# Patient Record
Sex: Female | Born: 1995 | Race: Black or African American | Hispanic: No | Marital: Single | Smoking: Never smoker
Health system: Southern US, Community
[De-identification: ages and names within clinical notes are randomized; demographics above are authoritative.]

## PROBLEM LIST (undated history)

## (undated) DIAGNOSIS — T7840XA Allergy, unspecified, initial encounter: Secondary | ICD-10-CM

## (undated) HISTORY — DX: Allergy, unspecified, initial encounter: T78.40XA

---
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
## 2018-11-16 ENCOUNTER — Telehealth: Payer: Self-pay

## 2018-11-16 ENCOUNTER — Ambulatory Visit: Payer: Self-pay | Admitting: *Deleted

## 2018-11-16 NOTE — Telephone Encounter (Signed)
We are unable to establish new patients at this time. We can do a video call to discuss symptoms or go to an urgent care clinic.  she can made an establish care appt once pandemic period is over.

## 2018-11-16 NOTE — Telephone Encounter (Signed)
Please advise New pt wanting to establish care. Pt went for a walk on Thursday 4/9  And after the walk pt said she took a shower and noticed that she couldn't smell her body wash. After showering pt made a brownie and noticed that she couldn't taste that. Pt wants to make an appointment today to talk about this issue.

## 2018-11-16 NOTE — Telephone Encounter (Signed)
Called Pt . Made appt for 11/17/2018 11:30, Pt will call or message through MyChart  To notify if she needs to change time. DoxyMe will be sent in the morning , holding off until she completely confirms . 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

## 2018-11-16 NOTE — Telephone Encounter (Signed)
Pt called with having symptoms of loss of taste and smell. That started last Thursday. She works for the city of [employer], Corporate treasurer. They are practice social distancing and routine of checking for fevers and other symptoms.  She is denying fever, shortness of breath, body aches,runny nose, dry cough or chest pain.  She moved here at the end of February from [place].  Flow at [facility] notified regarding recommendation and appointment.  She would like also to establish care with a provider there. Advised of having a virtual visit. Pt voiced understanding. Call conference in with LB at [facility]. Advised to call back or call 911 for respiratory distress. Pt voiced understanding.    Reason for Disposition . 1] COVID-19 infection diagnosed or suspected AND [2] mild symptoms (fever, cough) AND [2] no trouble breathing or other complications  Answer Assessment - Initial Assessment Questions 1. COVID-19 DIAGNOSIS: "Who made your Coronavirus (COVID-19) diagnosis?" "Was it confirmed by a positive lab test?" If not diagnosed by a HCP, ask "Are there lots of cases (community spread) where you live?" (See public health department website, if unsure)   * MAJOR community spread: high number of cases; numbers of cases are increasing; many people hospitalized.   * MINOR community spread: low number of cases; not increasing; few or no people hospitalized     Exposed out in the community 2. ONSET: "When did the COVID-19 symptoms start?"      Thursday evening 3. WORST SYMPTOM: "What is your worst symptom?" (e.g., cough, fever, shortness of breath, muscle aches)     Loss of smell and taste 4. COUGH: "How bad is the cough?"       Productive cough a day before that and now is gone 5. FEVER: "Do you have a fever?" If so, ask: "What is your temperature, how was it measured, and when did it start?"     no 6. RESPIRATORY STATUS: "Describe your breathing?" (e.g., shortness of  breath, wheezing, unable to speak)      no 7. BETTER-SAME-WORSE: "Are you getting better, staying the same or getting worse compared to yesterday?"  If getting worse, ask, "In what way?"     The same 8. HIGH RISK DISEASE: "Do you have any chronic medical problems?" (e.g., asthma, heart or lung disease, weak immune system, etc.)     Taking solaris for psariss 9. PREGNANCY: "Is there any chance you are pregnant?" "When was your last menstrual period?"     Not pregnant, on birth control last period was a year ago 10. OTHER SYMPTOMS: "Do you have any other symptoms?"  (e.g., runny nose, headache, sore throat, loss of smell)       Loss of smeel  Protocols used: CORONAVIRUS (COVID-19) DIAGNOSED OR SUSPECTED-A-AH

## 2018-11-16 NOTE — Telephone Encounter (Signed)
Lets add her for tomorrow, 30 min new patient appt. Thanks!

## 2018-11-17 ENCOUNTER — Ambulatory Visit (INDEPENDENT_AMBULATORY_CARE_PROVIDER_SITE_OTHER): Payer: 59 | Admitting: Family Medicine

## 2018-11-17 ENCOUNTER — Encounter: Payer: Self-pay | Admitting: Family Medicine

## 2018-11-17 DIAGNOSIS — R430 Anosmia: Secondary | ICD-10-CM | POA: Diagnosis not present

## 2018-11-17 NOTE — Assessment & Plan Note (Signed)
-  Discussed with her possibility that this could be related to COVID-19.  She has not other respiratory symptoms at this time however given increasing prevalence and current mitigation recommendations I have advised her to self quarantine for a period of at least 7 days since symptoms onset and continue this until 72 hours after symptoms have resolved.   -She will let me know if she has any new or worsening symptoms develop.

## 2018-11-17 NOTE — Progress Notes (Signed)
Alice StallsKorina A Stell - 23 y.o. female MRN [id number]  Date of birth: 02-Oct-1995   This visit type was conducted due to national recommendations for restrictions regarding the COVID-19 Pandemic (e.g. social distancing).  This format is felt to be most appropriate for this patient at this time.  All issues noted in this document were discussed and addressed.  No physical exam was performed (except for noted visual exam findings with Video Visits).  I discussed the limitations of evaluation and management by telemedicine and the availability of in person appointments. The patient expressed understanding and agreed to proceed.  I connected with@ on 11/17/18 at 11:30 AM EDT by a video enabled telemedicine application and verified that I am speaking with the correct person using two identifiers.   Patient Location: Home [address] [place] [id number]   Provider location:   [place]  Chief Complaint  Patient presents with  . Establish Care    NP, lost taste buds/ sense of smell , onset 6 days    HPI  Alice Kline is a 23 y.o. female who presents via audio/video conferencing for a telehealth visit today.  This is her initial visit and she has complaint of loss of taste and smell.  Reports that symptoms started about 5-6 days ago.  She noticed that she couldn't smell her body wash in the shower and has not been able to taste foods very well.  She did have a couple of days of nasal congestion prior to this.  She denies fever, chills, cough, shortness of breath, body aches, sinus pain or sore throat.  She is relatively healthy otherwise but does take stelara for psoriasis.    ROS:  A comprehensive ROS was completed and negative except as noted per HPI  Past Medical History:  Diagnosis Date  . Allergy     No past surgical history on file.  Family History  Problem Relation Age of Onset  . Cancer Maternal Grandfather   . Diabetes Maternal Grandfather   . Diabetes Paternal  Grandmother   . Hypertension Paternal Grandmother     Social History   Socioeconomic History  . Marital status: Single    Spouse name: Not on file  . Number of children: Not on file  . Years of education: Not on file  . Highest education level: Not on file  Occupational History  . Not on file  Social Needs  . Financial resource strain: Not on file  . Food insecurity:    Worry: Not on file    Inability: Not on file  . Transportation needs:    Medical: Not on file    Non-medical: Not on file  Tobacco Use  . Smoking status: Never Smoker  Substance and Sexual Activity  . Alcohol use: No  . Drug use: Never  . Sexual activity: Never    Birth control/protection: None  Lifestyle  . Physical activity:    Days per week: Not on file    Minutes per session: Not on file  . Stress: Not on file  Relationships  . Social connections:    Talks on phone: Not on file    Gets together: Not on file    Attends religious service: Not on file    Active member of club or organization: Not on file    Attends meetings of clubs or organizations: Not on file    Relationship status: Not on file  . Intimate partner violence:    Fear of current  or ex partner: Not on file    Emotionally abused: Not on file    Physically abused: Not on file    Forced sexual activity: Not on file  Other Topics Concern  . Not on file  Social History Narrative   ** Merged History Encounter **       [employer], 11 th grade     Current Outpatient Medications:  Marland Kitchen  Ustekinumab (STELARA) 45 MG/0.5ML SOLN, Inject into the skin., Disp: , Rfl:   EXAM:  VITALS per patient if applicable: BP 121/80 Comment: taken 3 wks ago.  Pulse 100 Comment: smartwatch pt reorted  Ht 5\' 6"  (1.676 m)   Wt 240 lb (108.9 kg) Comment: pt reported  BMI 38.74 kg/m   GENERAL: alert, oriented, appears well and in no acute distress  HEENT: atraumatic, conjunttiva clear, no obvious abnormalities on inspection of external nose and ears   NECK: normal movements of the head and neck  LUNGS: on inspection no signs of respiratory distress, breathing rate appears normal, no obvious gross SOB, gasping or wheezing  CV: no obvious cyanosis  MS: moves all visible extremities without noticeable abnormality  PSYCH/NEURO: pleasant and cooperative, no obvious depression or anxiety, speech and thought processing grossly intact  ASSESSMENT AND PLAN:  Discussed the following assessment and plan:  Anosmia -Discussed with her possibility that this could be related to COVID-19.  She has not other respiratory symptoms at this time however given increasing prevalence and current mitigation recommendations I have advised her to self quarantine for a period of at least 7 days since symptoms onset and continue this until 72 hours after symptoms have resolved.   -She will let me know if she has any new or worsening symptoms develop.        I discussed the assessment and treatment plan with the patient. The patient was provided an opportunity to ask questions and all were answered. The patient agreed with the plan and demonstrated an understanding of the instructions.   The patient was advised to call back or seek an in-person evaluation if the symptoms worsen or if the condition fails to improve as anticipated.     Everrett Coombe, DO

## 2019-10-28 ENCOUNTER — Ambulatory Visit: Payer: 59 | Attending: Internal Medicine

## 2019-10-28 DIAGNOSIS — Z23 Encounter for immunization: Secondary | ICD-10-CM

## 2019-10-28 NOTE — Progress Notes (Signed)
   Covid-19 Vaccination Clinic  Name:  Alice Kline    MRN: [id number] DOB: [birth year]  10/28/2019  [name] was observed post Covid-19 immunization for 15 minutes without incident. She was provided with Vaccine Information Sheet and instruction to access the V-Safe system.   [name] was instructed to call 911 with any severe reactions post vaccine: Marland Kitchen Difficulty breathing  . Swelling of face and throat  . A fast heartbeat  . A bad rash all over body  . Dizziness and weakness   Immunizations Administered    Name Date Dose VIS Date Route   Pfizer COVID-19 Vaccine 10/28/2019  2:17 PM 0.3 mL 07/15/2019 Intramuscular   Manufacturer: [employer], [employer]   Lot: [id number]   NDC: [address]-4

## 2019-11-23 ENCOUNTER — Ambulatory Visit: Payer: 59 | Attending: Internal Medicine

## 2019-11-23 DIAGNOSIS — Z23 Encounter for immunization: Secondary | ICD-10-CM

## 2019-11-23 NOTE — Progress Notes (Signed)
   Covid-19 Vaccination Clinic  Name:  Alice Kline    MRN: [id number] DOB: 1996-04-14  11/23/2019  [name] was observed post Covid-19 immunization for 15 minutes without incident. She was provided with Vaccine Information Sheet and instruction to access the V-Safe system.   [name] was instructed to call 911 with any severe reactions post vaccine: Marland Kitchen Difficulty breathing  . Swelling of face and throat  . A fast heartbeat  . A bad rash all over body  . Dizziness and weakness   Immunizations Administered    Name Date Dose VIS Date Route   Pfizer COVID-19 Vaccine 11/23/2019  2:41 PM 0.3 mL 09/28/2018 Intramuscular   Manufacturer: [employer], [employer]   Lot: [id number]   NDC: [address]-8

## 2022-05-21 ENCOUNTER — Encounter (HOSPITAL_COMMUNITY): Payer: Self-pay

## 2022-05-21 ENCOUNTER — Other Ambulatory Visit: Payer: Self-pay

## 2022-05-21 ENCOUNTER — Emergency Department (HOSPITAL_COMMUNITY): Payer: BC Managed Care – PPO

## 2022-05-21 ENCOUNTER — Emergency Department (HOSPITAL_COMMUNITY)
Admission: EM | Admit: 2022-05-21 | Discharge: 2022-05-21 | Disposition: A | Discharge status: Home / Self Care | Payer: No Typology Code available for payment source | Attending: Emergency Medicine | Admitting: Emergency Medicine

## 2022-05-21 DIAGNOSIS — M5440 Lumbago with sciatica, unspecified side: Secondary | ICD-10-CM

## 2022-05-21 DIAGNOSIS — M25552 Pain in left hip: Secondary | ICD-10-CM | POA: Diagnosis not present

## 2022-05-21 DIAGNOSIS — M5442 Lumbago with sciatica, left side: Secondary | ICD-10-CM | POA: Diagnosis not present

## 2022-05-21 DIAGNOSIS — M545 Low back pain, unspecified: Secondary | ICD-10-CM | POA: Diagnosis present

## 2022-05-21 LAB — PREGNANCY, URINE: Preg Test, Ur: NEGATIVE

## 2022-05-21 MED ORDER — OXYCODONE HCL 5 MG PO TABS
5.0000 mg | ORAL_TABLET | Freq: Once | ORAL | Status: AC
  Administered 2022-05-21: 5 mg via ORAL
  Filled 2022-05-21: qty 1

## 2022-05-21 MED ORDER — ACETAMINOPHEN 500 MG PO TABS
1000.0000 mg | ORAL_TABLET | Freq: Once | ORAL | Status: AC
  Administered 2022-05-21: 1000 mg via ORAL
  Filled 2022-05-21: qty 2

## 2022-05-21 MED ORDER — METHOCARBAMOL 500 MG PO TABS: 500.0000 mg | ORAL_TABLET | Freq: Two times a day (BID) | ORAL | 0 refills | Status: AC

## 2022-05-21 MED ORDER — KETOROLAC TROMETHAMINE 15 MG/ML IJ SOLN
15.0000 mg | Freq: Once | INTRAMUSCULAR | Status: AC
  Administered 2022-05-21: 15 mg via INTRAMUSCULAR
  Filled 2022-05-21: qty 1

## 2022-05-21 MED ORDER — OXYCODONE-ACETAMINOPHEN 5-325 MG PO TABS
1.0000 | ORAL_TABLET | ORAL | Status: DC | PRN
  Administered 2022-05-21: 1 via ORAL
  Filled 2022-05-21: qty 1

## 2022-05-21 MED ORDER — ONDANSETRON 4 MG PO TBDP
4.0000 mg | ORAL_TABLET | Freq: Once | ORAL | Status: AC
  Administered 2022-05-21: 4 mg via ORAL
  Filled 2022-05-21: qty 1

## 2022-05-21 NOTE — ED Notes (Signed)
Patient transported to X-ray 

## 2022-05-21 NOTE — ED Notes (Signed)
Patient verbalizes understanding of discharge instructions. Opportunity for questioning and answers were provided. Armband removed by staff, pt discharged from ED.  

## 2022-05-21 NOTE — ED Triage Notes (Signed)
Patient was at a work event playing tug of war and let on left hip > Complains of lower back pain and left hip pain. Complains of pain going down left leg

## 2022-05-21 NOTE — ED Provider Triage Note (Signed)
Emergency Medicine Provider Triage Evaluation Note  Alice Kline , a 26 y.o. female  was evaluated in triage.  Pt complains of acute onset low back pain and left hip pain.  Was playing tug-of-war at work today, fell onto left hip/lower back around 11 AM this morning.  Felt instant pain.  Now with some difficulty walking and exquisite tenderness around the area.  Reports radiating pain down the left leg.  Denies urinary/bowel incontinence or saddle anesthesia.  Denies hitting her head or LOC.  No prior spinal surgeries.  Denies possibility of pregnancy.  Review of Systems  Positive:  Negative: See above  Physical Exam  BP 132/89   Pulse 100   Temp 98.3 F (36.8 C)   Resp 16   Ht 5\' 6"  (1.676 m)   Wt 108.9 kg   LMP 04/26/2022   SpO2 99%   BMI 38.74 kg/m  Gen:   Awake, no distress, sitting on right side, appears uncomfortable Resp:  Normal effort  MSK:   Moves extremities without difficulty  Other:  Midline lumbar tenderness with tenderness to left hip.  Without significant bony deformity or obvious leg shortening.  Still able to mildly extend and flex left hip joint without significant difficulty.  Medical Decision Making  Medically screening exam initiated at 3:19 PM.  Appropriate orders placed.  Alice Kline was informed that the remainder of the evaluation will be completed by another provider, this initial triage assessment does not replace that evaluation, and the importance of remaining in the ED until their evaluation is complete.  Urine pregnancy and imaging ordered.   Prince Rome, PA-C 60/10/93 1524
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
## 2022-09-19 ENCOUNTER — Other Ambulatory Visit: Payer: Self-pay

## 2022-09-19 ENCOUNTER — Emergency Department (HOSPITAL_COMMUNITY)
Admission: EM | Admit: 2022-09-19 | Discharge: 2022-09-20 | Disposition: A | Discharge status: Home / Self Care | Payer: No Typology Code available for payment source | Attending: Emergency Medicine | Admitting: Emergency Medicine

## 2022-09-19 ENCOUNTER — Emergency Department (HOSPITAL_COMMUNITY): Payer: No Typology Code available for payment source

## 2022-09-19 ENCOUNTER — Encounter (HOSPITAL_COMMUNITY): Payer: Self-pay

## 2022-09-19 DIAGNOSIS — S8992XA Unspecified injury of left lower leg, initial encounter: Secondary | ICD-10-CM | POA: Insufficient documentation

## 2022-09-19 DIAGNOSIS — Y92810 Car as the place of occurrence of the external cause: Secondary | ICD-10-CM | POA: Diagnosis not present

## 2022-09-19 DIAGNOSIS — S8991XA Unspecified injury of right lower leg, initial encounter: Secondary | ICD-10-CM

## 2022-09-19 DIAGNOSIS — M7989 Other specified soft tissue disorders: Secondary | ICD-10-CM | POA: Diagnosis not present

## 2022-09-19 DIAGNOSIS — W228XXA Striking against or struck by other objects, initial encounter: Secondary | ICD-10-CM | POA: Diagnosis not present

## 2022-09-19 NOTE — Discharge Instructions (Signed)
You were seen in the ER for any injury.  As we discussed your x-ray did not show any broken bones.  We have placed you in immobilizer for comfort.  I suspect you likely sprained the knee.  I recommend the RICE method-rest, ice, compression, and elevation.  You can take ibuprofen and/or Tylenol as needed for pain.    I have attached contact information for the orthopedist for you to follow-up with if your symptoms persist.
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
